# Patient Record
Sex: Female | Born: 1965 | Race: White | Hispanic: Yes | Marital: Single | Smoking: Never smoker
Health system: Southern US, Community
[De-identification: ages and names within clinical notes are randomized; demographics above are authoritative.]

## PROBLEM LIST (undated history)

## (undated) DIAGNOSIS — I10 Essential (primary) hypertension: Secondary | ICD-10-CM

---

## 2004-04-22 ENCOUNTER — Emergency Department (HOSPITAL_COMMUNITY): Admission: EM | Admit: 2004-04-22 | Discharge: 2004-04-22 | Payer: Self-pay | Admitting: *Deleted

## 2004-08-01 ENCOUNTER — Emergency Department (HOSPITAL_COMMUNITY): Admission: EM | Admit: 2004-08-01 | Discharge: 2004-08-01 | Payer: Self-pay | Admitting: Emergency Medicine

## 2004-08-29 ENCOUNTER — Ambulatory Visit (HOSPITAL_COMMUNITY): Admission: RE | Admit: 2004-08-29 | Discharge: 2004-08-29 | Payer: Self-pay | Admitting: *Deleted

## 2004-08-29 ENCOUNTER — Ambulatory Visit: Payer: Self-pay | Admitting: *Deleted

## 2004-11-14 ENCOUNTER — Ambulatory Visit (HOSPITAL_COMMUNITY): Admission: RE | Admit: 2004-11-14 | Discharge: 2004-11-14 | Payer: Self-pay | Admitting: Obstetrics and Gynecology

## 2004-12-19 ENCOUNTER — Ambulatory Visit: Payer: Self-pay | Admitting: Family Medicine

## 2004-12-19 ENCOUNTER — Inpatient Hospital Stay (HOSPITAL_COMMUNITY): Admission: AD | Admit: 2004-12-19 | Discharge: 2004-12-19 | Payer: Self-pay | Admitting: *Deleted

## 2004-12-20 ENCOUNTER — Inpatient Hospital Stay (HOSPITAL_COMMUNITY): Admission: AD | Admit: 2004-12-20 | Discharge: 2004-12-22 | Payer: Self-pay | Admitting: Family Medicine

## 2004-12-20 ENCOUNTER — Ambulatory Visit: Payer: Self-pay | Admitting: Obstetrics and Gynecology

## 2006-03-05 ENCOUNTER — Ambulatory Visit: Payer: Self-pay | Admitting: Obstetrics & Gynecology

## 2006-04-02 ENCOUNTER — Ambulatory Visit (HOSPITAL_COMMUNITY): Admission: RE | Admit: 2006-04-02 | Discharge: 2006-04-02 | Payer: Self-pay | Admitting: Obstetrics & Gynecology

## 2006-04-02 ENCOUNTER — Ambulatory Visit: Payer: Self-pay | Admitting: Obstetrics & Gynecology

## 2006-04-16 ENCOUNTER — Ambulatory Visit: Payer: Self-pay | Admitting: Obstetrics & Gynecology

## 2007-02-26 IMAGING — CR DG CHEST 1V
1 series · 1 of 1 positions shown · non-contrast
Comparison: None.

CLINICAL DATA: Chest pain and shortness of breath. Four months pregnant. Double
shielded.

CHEST - 1 VIEW

[view not recorded]
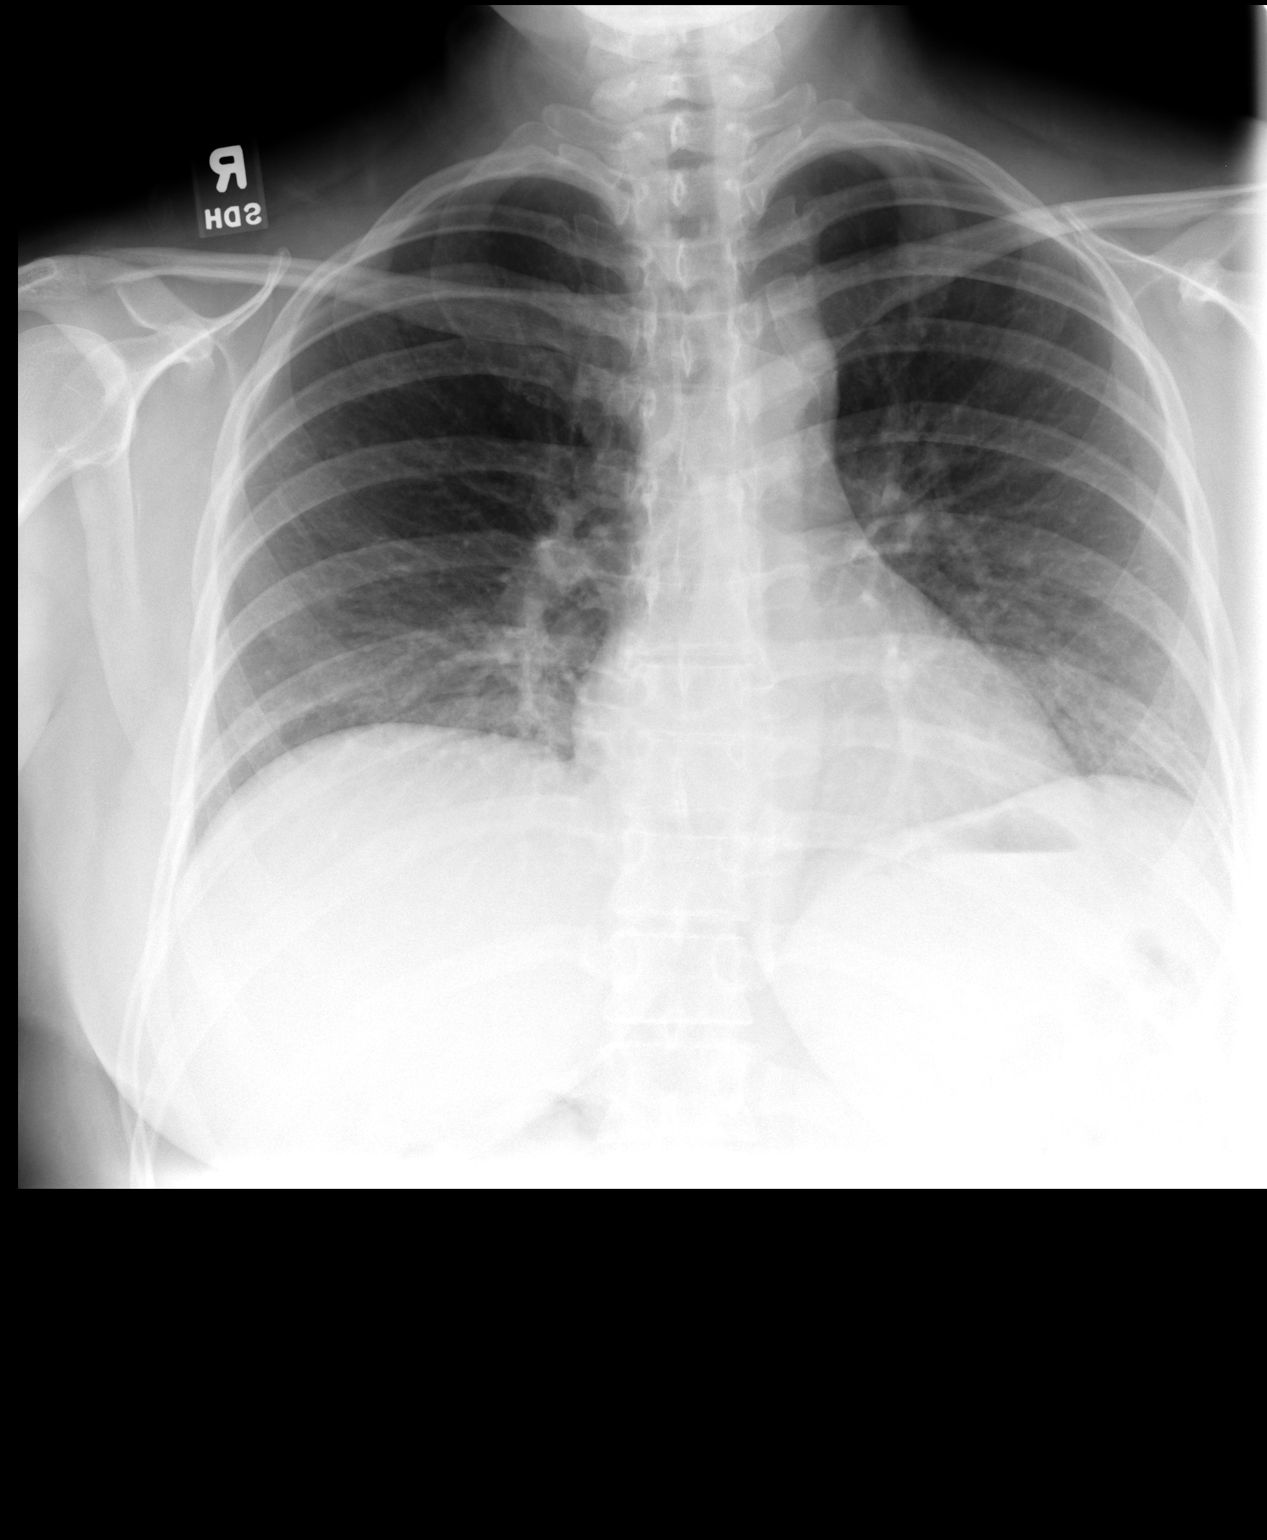

[1 of 1 positions shown; findings below may reference images not displayed]

FINDINGS: Normal sized heart. Poor inspiration. Mild diffuse peribronchial
thickening. Minimal scoliosis.

IMPRESSION

Mild bronchitic changes.

## 2007-06-11 IMAGING — US US OB FOLLOW-UP
1 series · 18 of 28 positions shown · non-contrast
Comparison: none

CLINICAL DATA: 34 weeks 6 days assigned gestational age.  Measuring small for dates.  Evaluate fetal growth and amniotic fluid.

[Series 1: us ob re-eval · 18 of 30 slices shown]
[im 1/30]
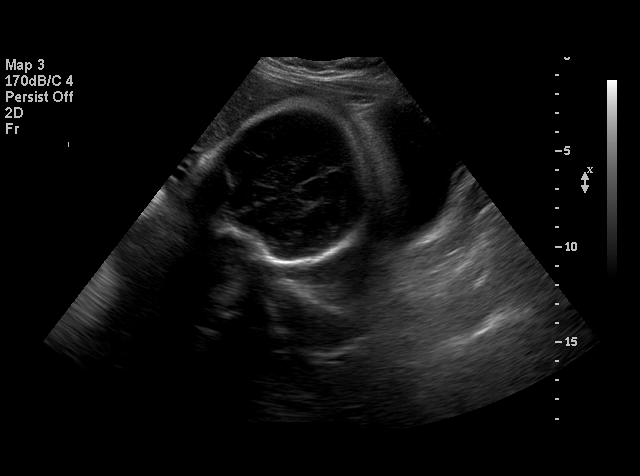
[im 3/30]
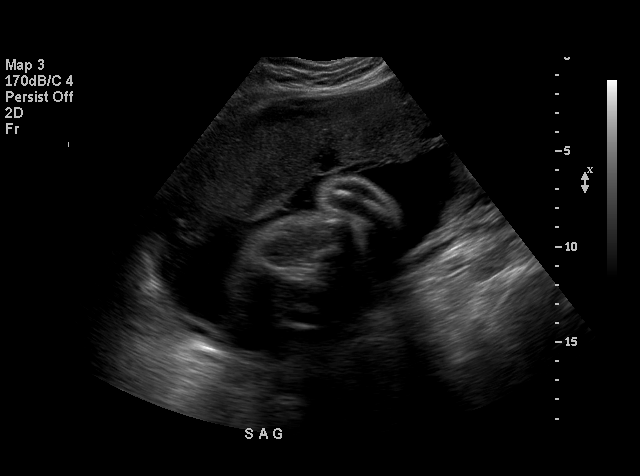
[im 4/30]
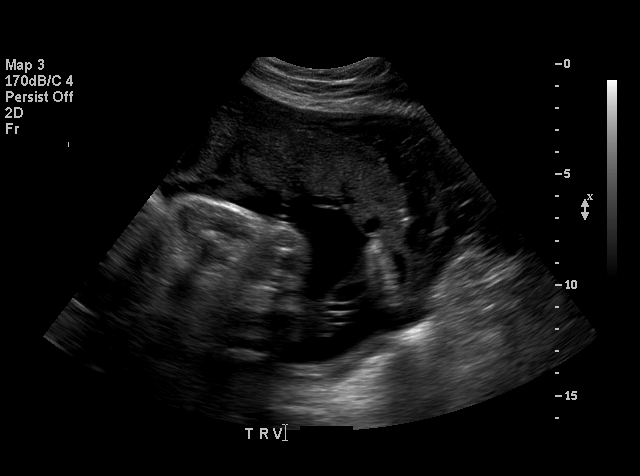
[im 6/30]
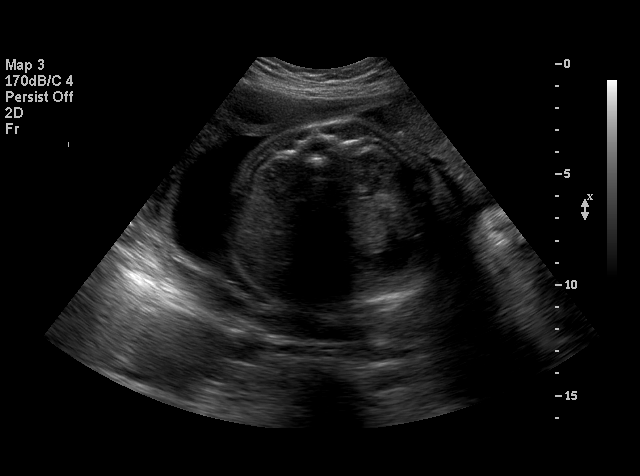
[im 8/30]
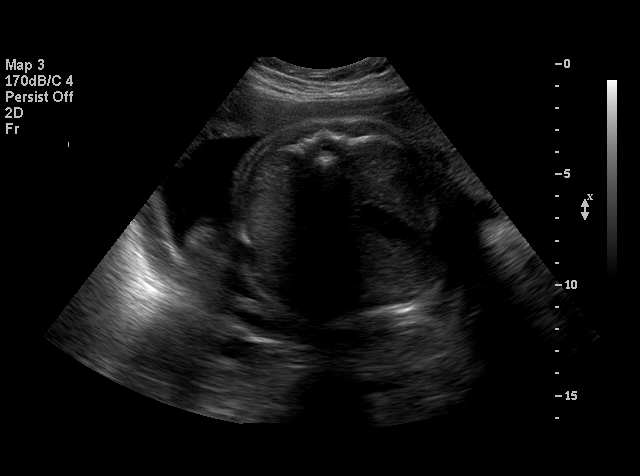
[im 9/30]
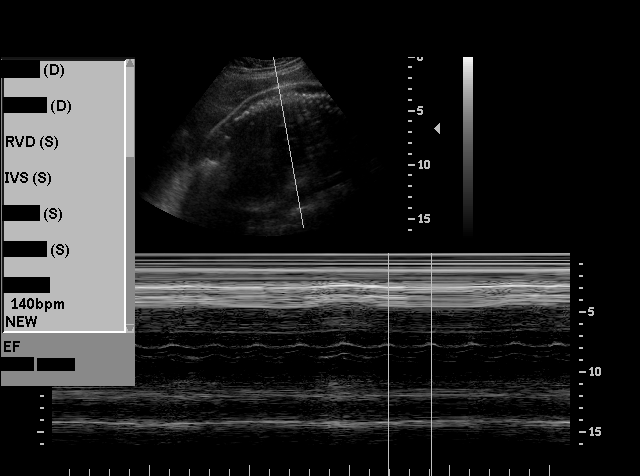
[im 11/30]
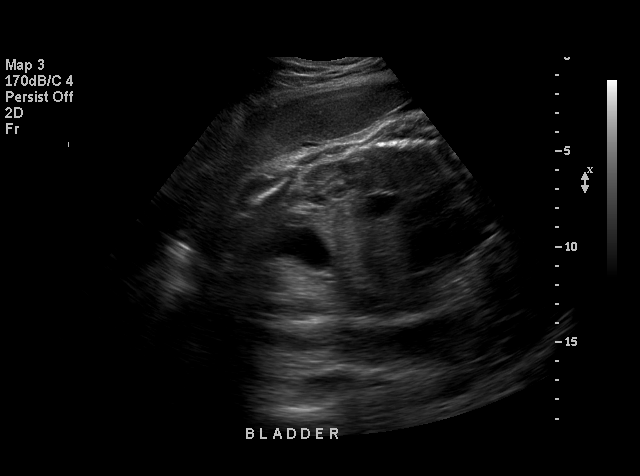
[im 12/30]
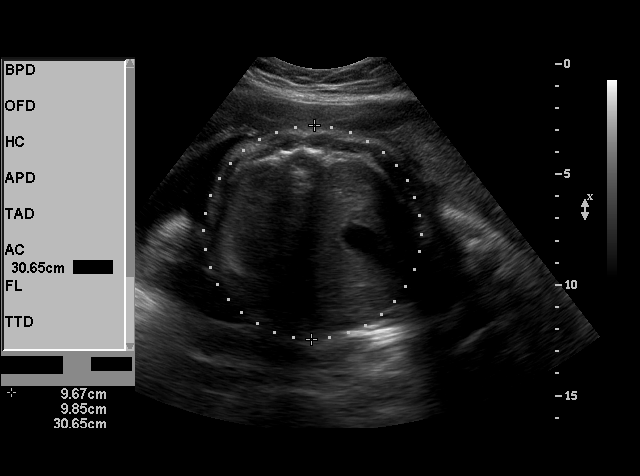
[im 14/30]
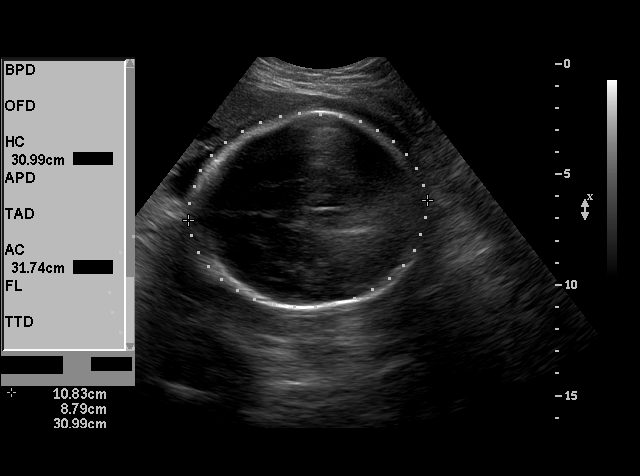
[im 16/30]
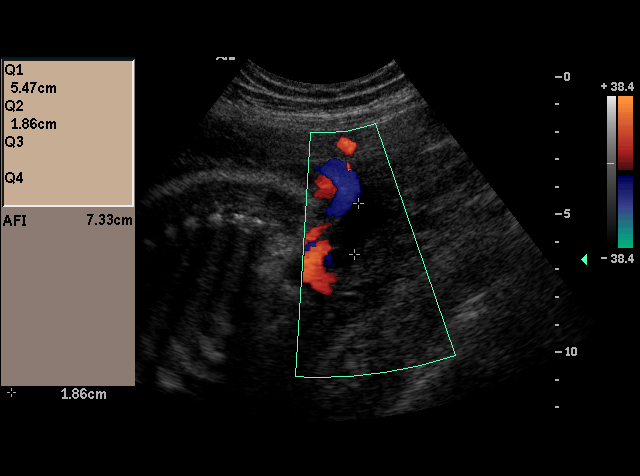
[im 18/30]
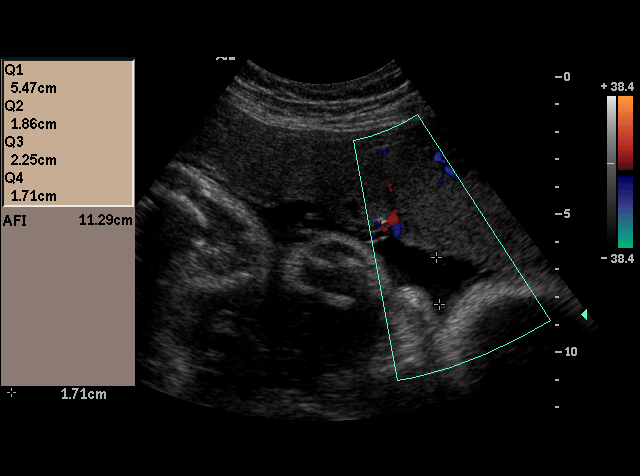
[im 19/30]
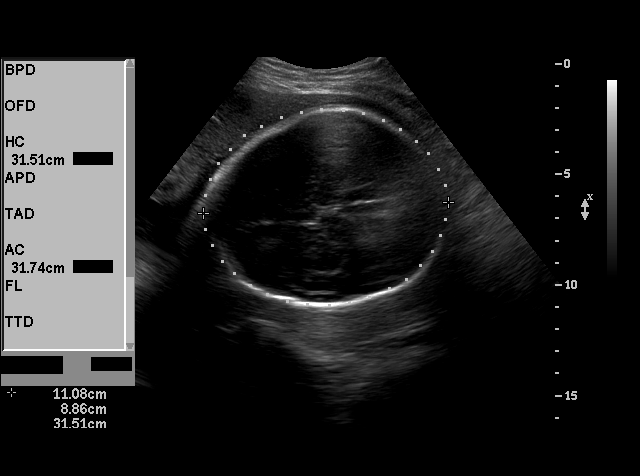
[im 21/30]
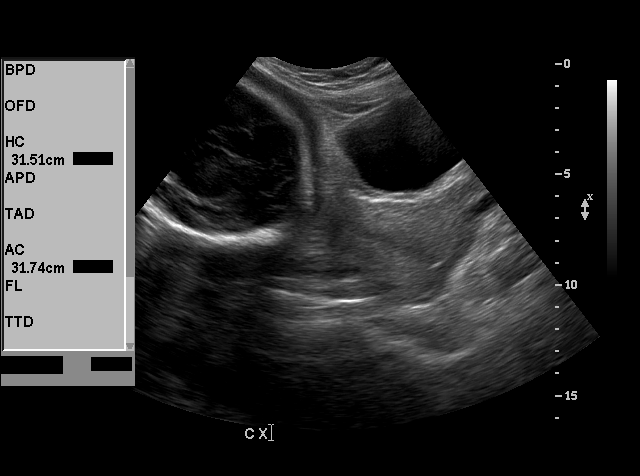
[im 23/30]
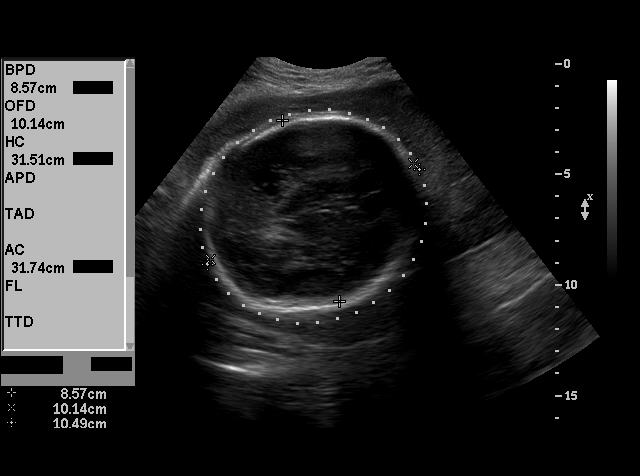
[im 24/30]
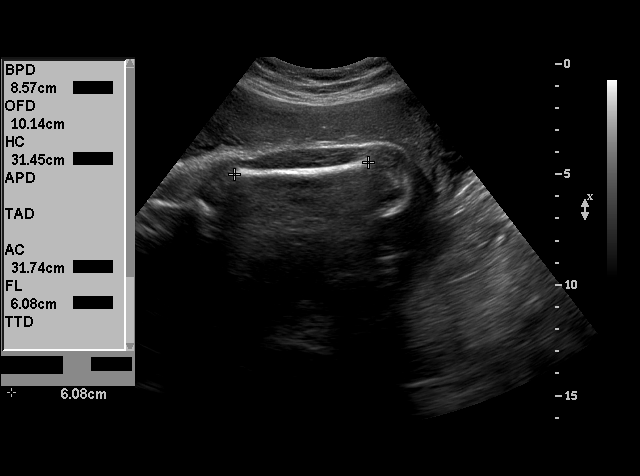
[im 26/30]
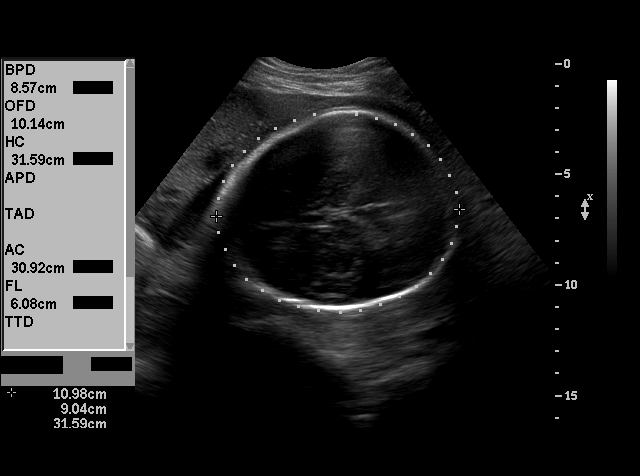
[im 27/30]
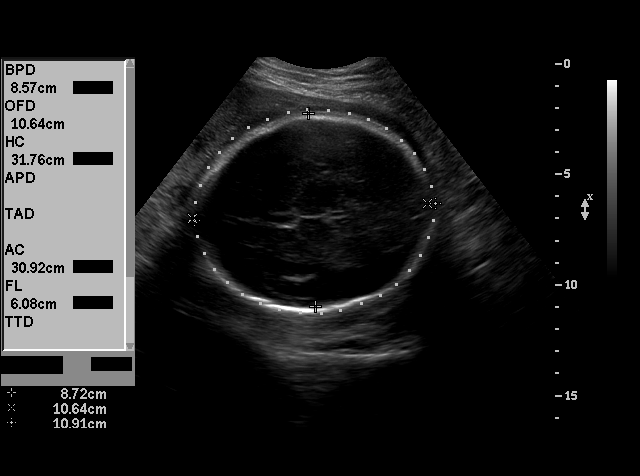
[im 30/30]
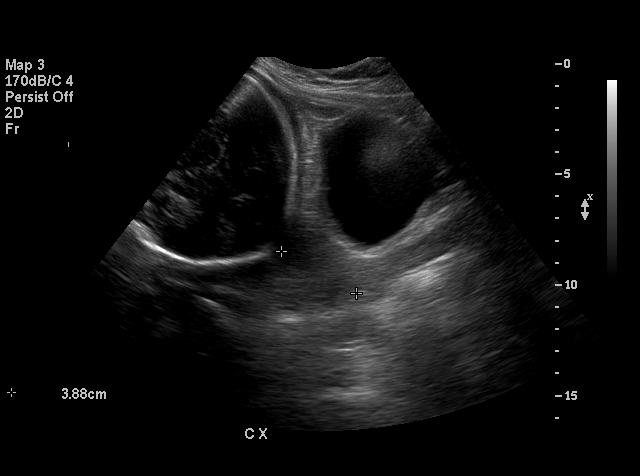

[18 of 28 positions shown; findings below may reference images not displayed]

OBSTETRICAL ULTRASOUND RE-EVALUATION:
Number of Fetuses:  1
Heart Rate:  140 bpm
Movement:  Yes
Breathing:  Yes
Presentation:  Cephalic
Placental Location:  Anterior
Grade:  2
Previa:  No
Amniotic Fluid (subjective):  Normal
Amniotic Fluid (objective):  AFI:  11.3 cm (7.9-24.9 cm = 5th and 95th %ile for 35 weeks)

FETAL BIOMETRY
BPD:  8.6 cm  34 w  4 d
HC:  31.2 cm 35 w  1 d
AC:  31.1 cm  35 w  1 d
FL:  6.1 cm  37 w  5 d

Mean GA:  34 w  1 d
Assigned GA:  34 w  6 d

EFW:  7646 grams (S)  50-75%ile  (9787-2671g) for 35 weeks

FETAL ANATOMY
Lateral Ventricles:  Visualized 
Thalami/CSP:  Previously Visualized 
Posterior Fossa:  Previously Visualized 
Nuchal Region:  Previously Visualized 
Spine:  Previously Visualized 
4 Chamber Heart on Left:  Previously Visualized 
Stomach on Left:  Visualized 
3 Vessel Cord:  Previously Visualized 
Cord Insertion Site:  Previously Visualized 
Kidneys:  Visualized 
Bladder:  Visualized 
Extremities:  Previously Visualized 

MATERNAL UTERINE AND ADNEXAL FINDINGS
Cervix:  5.0 cm transvaginally
IMPRESSION: 1.  Assigned gestational age is currently 34 weeks 6 days.  Appropriate fetal growth, with EFW at 50-75th percentile.

2.  Normal amniotic fluid volume.  Normal cervical length.

## 2010-11-07 ENCOUNTER — Emergency Department (HOSPITAL_COMMUNITY)
Admission: EM | Admit: 2010-11-07 | Discharge: 2010-11-08 | Disposition: A | Discharge status: Home / Self Care | Payer: Worker's Compensation | Attending: Emergency Medicine | Admitting: Emergency Medicine

## 2010-11-07 DIAGNOSIS — X58XXXA Exposure to other specified factors, initial encounter: Secondary | ICD-10-CM | POA: Insufficient documentation

## 2010-11-07 DIAGNOSIS — S0003XA Contusion of scalp, initial encounter: Secondary | ICD-10-CM | POA: Insufficient documentation

## 2010-11-07 DIAGNOSIS — S1093XA Contusion of unspecified part of neck, initial encounter: Secondary | ICD-10-CM | POA: Insufficient documentation

## 2010-11-08 ENCOUNTER — Emergency Department (HOSPITAL_COMMUNITY): Payer: Worker's Compensation

## 2010-11-08 LAB — POCT I-STAT, CHEM 8
BUN: 21 mg/dL (ref 6–23)
Calcium, Ion: 1.22 mmol/L (ref 1.12–1.32)
Chloride: 105 mEq/L (ref 96–112)
Creatinine, Ser: 0.7 mg/dL (ref 0.50–1.10)
Glucose, Bld: 106 mg/dL — ABNORMAL HIGH (ref 70–99)
HCT: 38 % (ref 36.0–46.0)
Hemoglobin: 12.9 g/dL (ref 12.0–15.0)
Potassium: 4.5 mEq/L (ref 3.5–5.1)
Sodium: 138 mEq/L (ref 135–145)
TCO2: 25 mmol/L (ref 0–100)

## 2010-11-08 MED ORDER — IOHEXOL 300 MG/ML  SOLN: 75.0000 mL | Freq: Once | INTRAMUSCULAR | Status: DC | PRN

## 2011-11-08 ENCOUNTER — Emergency Department (HOSPITAL_COMMUNITY): Payer: Worker's Compensation

## 2011-11-08 ENCOUNTER — Encounter (HOSPITAL_COMMUNITY): Payer: Self-pay | Admitting: Emergency Medicine

## 2011-11-08 ENCOUNTER — Emergency Department (HOSPITAL_COMMUNITY)
Admission: EM | Admit: 2011-11-08 | Discharge: 2011-11-08 | Disposition: A | Discharge status: Home / Self Care | Payer: Worker's Compensation | Attending: Emergency Medicine | Admitting: Emergency Medicine

## 2011-11-08 DIAGNOSIS — S93401A Sprain of unspecified ligament of right ankle, initial encounter: Secondary | ICD-10-CM

## 2011-11-08 DIAGNOSIS — Y9289 Other specified places as the place of occurrence of the external cause: Secondary | ICD-10-CM | POA: Insufficient documentation

## 2011-11-08 DIAGNOSIS — S93409A Sprain of unspecified ligament of unspecified ankle, initial encounter: Secondary | ICD-10-CM | POA: Insufficient documentation

## 2011-11-08 DIAGNOSIS — W010XXA Fall on same level from slipping, tripping and stumbling without subsequent striking against object, initial encounter: Secondary | ICD-10-CM | POA: Insufficient documentation

## 2011-11-08 DIAGNOSIS — Y990 Civilian activity done for income or pay: Secondary | ICD-10-CM | POA: Insufficient documentation

## 2011-11-08 DIAGNOSIS — M25551 Pain in right hip: Secondary | ICD-10-CM

## 2011-11-08 DIAGNOSIS — W19XXXA Unspecified fall, initial encounter: Secondary | ICD-10-CM

## 2011-11-08 DIAGNOSIS — M25559 Pain in unspecified hip: Secondary | ICD-10-CM | POA: Insufficient documentation

## 2011-11-08 MED ORDER — CYCLOBENZAPRINE HCL 5 MG PO TABS: 5.0000 mg | ORAL_TABLET | Freq: Three times a day (TID) | ORAL | Status: AC | PRN

## 2011-11-08 MED ORDER — IBUPROFEN 800 MG PO TABS: 800.0000 mg | ORAL_TABLET | Freq: Three times a day (TID) | ORAL | Status: AC

## 2011-11-08 MED ORDER — TRAMADOL HCL 50 MG PO TABS
50.0000 mg | ORAL_TABLET | Freq: Once | ORAL | Status: AC
  Administered 2011-11-08: 50 mg via ORAL
  Filled 2011-11-08: qty 1

## 2011-11-08 NOTE — ED Notes (Signed)
Son stated, (translator) SHE FELL AT WORK AND C/O RT. FOOT AND LEG PAIN

## 2011-11-08 NOTE — Progress Notes (Signed)
Orthopedic Tech Progress Note Patient Details:  Shari Hamilton 1966-01-21 [id number]  Ortho Devices Type of Ortho Device: ASO Ortho Device/Splint Location: right LE Ortho Device/Splint Interventions: Application   Shari Hamilton T 11/08/2011, 10:24 AM

## 2011-11-08 NOTE — ED Provider Notes (Signed)
History     CSN: [id number]  Arrival date & time 11/08/11  0756   First MD Initiated Contact with Patient 11/08/11 [phone]      Chief Complaint  Patient presents with  . Foot Injury    (Consider location/radiation/quality/duration/timing/severity/associated sxs/prior treatment) HPI Hx from pt. Shari Hamilton is a 46 y.o. female who presents for evaluation of right leg pain. States she was at work when she tripped over some boxes and fell, inverting her ankle in the process and landing on her buttocks. She is currently experiencing pain to the lateral right hip and to her lateral right ankle. She denies any numbness or tingling to the extremity or swelling to the areas. No treatment prior to coming here. Pain is throbbing in nature and worsens with walking and weight bearing.    History reviewed. No pertinent past medical history.  History reviewed. No pertinent past surgical history.  No family history on file.  History  Substance Use Topics  . Smoking status: Never Smoker   . Smokeless tobacco: Not on file  . Alcohol Use: No    OB History    Grav Para Term Preterm Abortions TAB SAB Ect Mult Living                  Review of Systems  Constitutional: Negative.   Musculoskeletal: Positive for arthralgias. Negative for back pain and gait problem.  Skin: Negative for wound.  Neurological: Negative for weakness and numbness.    Allergies  Review of patient's allergies indicates no known allergies.  Home Medications   Current Outpatient Rx  Name Route Sig Dispense Refill  . DESLORATADINE 5 MG PO TABS Oral Take 5 mg by mouth daily as needed.    . ADULT MULTIVITAMIN W/MINERALS CH Oral Take 1 tablet by mouth daily.    . CYCLOBENZAPRINE HCL 5 MG PO TABS Oral Take 1 tablet (5 mg total) by mouth 3 (three) times daily as needed for muscle spasms. 30 tablet 0  . IBUPROFEN 800 MG PO TABS Oral Take 1 tablet (800 mg total) by mouth 3 (three) times daily. Take with food. 21  tablet 0    BP 121/85  Pulse 92  Temp 98 F (36.7 C) (Oral)  Resp 18  SpO2 94%  LMP 11/07/2011  Physical Exam  Nursing note and vitals reviewed. Constitutional: She appears well-developed and well-nourished. No distress.  HENT:  Head: Normocephalic and atraumatic.  Neck: Normal range of motion.  Cardiovascular: Normal rate.   Pulmonary/Chest: Effort normal.  Musculoskeletal: Normal range of motion.       Legs:      R hip: ttp over the greater trochanteric area. No crepitus appreciated. No LE shortening/rotation. Good functional rom in the hip.   R ankle: ttp to lat malleolus, deltoid ligament. Nontender to palp over 5th MT. Good functional passive rom in the ankle, active rom limited 2/2 pain. Anterior drawer testing neg.  Spine: No palpable stepoff, crepitus, or gross deformity appreciated. No appreciable spasm of paravertebral muscles. No midline tenderness.  nontender to palp to the remainder of the leg, good rom in knee, ext neurovasc intact, 2+ dp/pt pulses  Neurological: She is alert.  Skin: Skin is warm and dry. She is not diaphoretic.  Psychiatric: She has a normal mood and affect.    ED Course  Procedures (including critical care time)  Labs Reviewed - No data to display Dg Hip Complete Right  11/08/2011  *RADIOLOGY REPORT*  Clinical Data: Fall, right hip  pain  RIGHT HIP - COMPLETE 2+ VIEW  Comparison: Concurrently obtained radiographs of the right ankle  Findings: Frontal pelvic radiograph and additional views of the right hip demonstrate no acute fracture, or malalignment.  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  Minimal degenerative changes noted in the left hip joint.  Surgical clips project over the anatomic pelvis consistent with bilateral tubal ligation. Visualized bowel gas pattern is unremarkable.  IMPRESSION:  1.  No  acute fracture, or malalignment. 2.  Mild right hip joint degenerative changes with prominence of the lateral aspect of the acetabular roof and associated subchondral cyst formation.  Similar findings can be seen in a pincer type femoral acetabular impingement (FAI).   Original Report Authenticated By: Alvino Blood Ankle Complete Right  11/08/2011  *RADIOLOGY REPORT*  Clinical Data: Fall, lateral ankle pain  RIGHT ANKLE - COMPLETE 3+ VIEW  Comparison: Concurrently obtained radiographs of the pelvis and right hip  Findings: No acute fracture, malalignment or ankle joint effusion. The ankle mortise is congruent.  There is mild soft tissue swelling about the anterior and lateral ankle at the malleolus.  The base of the fifth metatarsal is intact.  IMPRESSION: Soft tissue swelling about the lateral malleolus without evidence of acute fracture, or bony malalignment.   Original Report Authenticated By: Vilma Prader    I personally reviewed the xrays via PACS  1. Right ankle sprain   2. Right hip pain   3. Fall       MDM  Pt presents with pain to her R lower ext after a fall while at work. She is experiencing some pain around the lat malleolus of her ankle and around the greater trochanter of her hip. She is neurovasc intact and has good rom in the joints. Xrays reviewed by me, neg for fx - some slight degenerative changes in hip. Advised conservative tx with ice, rest, elevation, NSAIDs. Will give ASO for ankle. Advised f/u with ortho in several days if not improving. Reasons to return to ED discussed.       Grant Fontana, PA-C 11/08/11 1007

## 2011-11-08 NOTE — ED Provider Notes (Signed)
Medical screening examination/treatment/procedure(s) were performed by non-physician practitioner and as supervising physician I was immediately available for consultation/collaboration.   Octavious Zidek, MD 11/08/11 2030 

## 2015-02-26 DIAGNOSIS — N10 Acute tubulo-interstitial nephritis: Secondary | ICD-10-CM | POA: Insufficient documentation

## 2020-06-10 DIAGNOSIS — R102 Pelvic and perineal pain: Secondary | ICD-10-CM | POA: Insufficient documentation

## 2020-06-10 DIAGNOSIS — A498 Other bacterial infections of unspecified site: Secondary | ICD-10-CM | POA: Insufficient documentation

## 2022-04-04 ENCOUNTER — Other Ambulatory Visit: Payer: Self-pay

## 2022-04-04 ENCOUNTER — Emergency Department (HOSPITAL_COMMUNITY)
Admission: EM | Admit: 2022-04-04 | Discharge: 2022-04-04 | Disposition: A | Discharge status: Home / Self Care | Payer: Self-pay | Attending: Emergency Medicine | Admitting: Emergency Medicine

## 2022-04-04 ENCOUNTER — Encounter (HOSPITAL_COMMUNITY): Payer: Self-pay

## 2022-04-04 ENCOUNTER — Emergency Department (HOSPITAL_COMMUNITY): Payer: Worker's Compensation

## 2022-04-04 DIAGNOSIS — I10 Essential (primary) hypertension: Secondary | ICD-10-CM | POA: Insufficient documentation

## 2022-04-04 DIAGNOSIS — R112 Nausea with vomiting, unspecified: Secondary | ICD-10-CM

## 2022-04-04 DIAGNOSIS — K2970 Gastritis, unspecified, without bleeding: Secondary | ICD-10-CM

## 2022-04-04 HISTORY — DX: Essential (primary) hypertension: I10

## 2022-04-04 LAB — LIPASE, BLOOD: Lipase: 32 U/L (ref 11–51)

## 2022-04-04 LAB — CBC WITH DIFFERENTIAL/PLATELET
Abs Immature Granulocytes: 0.03 10*3/uL (ref 0.00–0.07)
Basophils Absolute: 0.1 10*3/uL (ref 0.0–0.1)
Basophils Relative: 1 %
Eosinophils Absolute: 0.3 10*3/uL (ref 0.0–0.5)
Eosinophils Relative: 3 %
HCT: 46.4 % — ABNORMAL HIGH (ref 36.0–46.0)
Hemoglobin: 14.7 g/dL (ref 12.0–15.0)
Immature Granulocytes: 0 %
Lymphocytes Relative: 35 %
Lymphs Abs: 3 10*3/uL (ref 0.7–4.0)
MCH: 27.4 pg (ref 26.0–34.0)
MCHC: 31.7 g/dL (ref 30.0–36.0)
MCV: 86.4 fL (ref 80.0–100.0)
Monocytes Absolute: 0.4 10*3/uL (ref 0.1–1.0)
Monocytes Relative: 5 %
Neutro Abs: 4.6 10*3/uL (ref 1.7–7.7)
Neutrophils Relative %: 56 %
Platelets: 307 10*3/uL (ref 150–400)
RBC: 5.37 MIL/uL — ABNORMAL HIGH (ref 3.87–5.11)
RDW: 13.9 % (ref 11.5–15.5)
WBC: 8.3 10*3/uL (ref 4.0–10.5)
nRBC: 0 % (ref 0.0–0.2)

## 2022-04-04 LAB — URINALYSIS, ROUTINE W REFLEX MICROSCOPIC
Bilirubin Urine: NEGATIVE
Glucose, UA: NEGATIVE mg/dL
Ketones, ur: NEGATIVE mg/dL
Nitrite: NEGATIVE
Protein, ur: NEGATIVE mg/dL
Specific Gravity, Urine: 1.001 — ABNORMAL LOW (ref 1.005–1.030)
pH: 7 (ref 5.0–8.0)

## 2022-04-04 LAB — COMPREHENSIVE METABOLIC PANEL
ALT: 33 U/L (ref 0–44)
AST: 30 U/L (ref 15–41)
Albumin: 3.2 g/dL — ABNORMAL LOW (ref 3.5–5.0)
Alkaline Phosphatase: 81 U/L (ref 38–126)
Anion gap: 12 (ref 5–15)
BUN: 8 mg/dL (ref 6–20)
CO2: 25 mmol/L (ref 22–32)
Calcium: 9 mg/dL (ref 8.9–10.3)
Chloride: 100 mmol/L (ref 98–111)
Creatinine, Ser: 0.45 mg/dL (ref 0.44–1.00)
GFR, Estimated: >60 mL/min (ref >60)
Glucose, Bld: 168 mg/dL — ABNORMAL HIGH (ref 70–99)
Potassium: 4.1 mmol/L (ref 3.5–5.1)
Sodium: 137 mmol/L (ref 135–145)
Total Bilirubin: 0.9 mg/dL (ref 0.3–1.2)
Total Protein: 7 g/dL (ref 6.5–8.1)

## 2022-04-04 LAB — PREGNANCY, URINE: Preg Test, Ur: NEGATIVE

## 2022-04-04 MED ORDER — ONDANSETRON 4 MG PO TBDP
4.0000 mg | ORAL_TABLET | Freq: Once | ORAL | Status: AC
  Administered 2022-04-04: 4 mg via ORAL
  Filled 2022-04-04: qty 1

## 2022-04-04 MED ORDER — ALUM & MAG HYDROXIDE-SIMETH 200-200-20 MG/5ML PO SUSP
30.0000 mL | Freq: Once | ORAL | Status: AC
  Administered 2022-04-04: 30 mL via ORAL
  Filled 2022-04-04: qty 30

## 2022-04-04 MED ORDER — DICYCLOMINE HCL 10 MG PO CAPS
10.0000 mg | ORAL_CAPSULE | Freq: Once | ORAL | Status: AC
  Administered 2022-04-04: 10 mg via ORAL
  Filled 2022-04-04: qty 1

## 2022-04-04 MED ORDER — FAMOTIDINE 20 MG PO TABS: 20.0000 mg | ORAL_TABLET | Freq: Two times a day (BID) | ORAL | 0 refills | Status: AC

## 2022-04-04 MED ORDER — ONDANSETRON 4 MG PO TBDP: 8.0000 mg | ORAL_TABLET | Freq: Once | ORAL | Status: DC

## 2022-04-04 NOTE — ED Provider Triage Note (Addendum)
Emergency Medicine Provider Triage Evaluation Note  Shari Hamilton , a 57 y.o. female  was evaluated in triage.  Pt complains of epigastric pain that has been present for 6 days. Patient endorsed 4 episodes non bloody emesis on Sunday.  Patient states she still feels nauseous.  Patient stated epigastric pain does not radiate.  Patient also endorsed dizziness that has been present for several months now.  Patient does not know what causes her dizziness but denied any episodes of syncope.  Patient denied chest pain, shortness of breath, urinary/bowel symptoms, changes in sensation/motor skills  Review of Systems  Positive: See HPI Negative: See HPI  Physical Exam  BP (!) 165/100 (BP Location: Right Arm)   Pulse (!) 107   Temp 98.9 F (37.2 C) (Oral)   Resp 18   SpO2 91%  Gen:   Awake, no distress   Resp:  Normal effort, no respiratory distress MSK:   Moves extremities without difficulty  Other:  Epigastric tenderness however no peritoneal signs noted, lungs clear to auscultation bilaterally, heart was slightly tachycardic however no rubs murmurs gallops or clicks noted, patient had equal strength in both hands and had PERRL eyes  Medical Decision Making  Medically screening exam initiated at 1:01 PM.  Appropriate orders placed.  Shaneeka Navarro-Rodriguez was informed that the remainder of the evaluation will be completed by another provider, this initial triage assessment does not replace that evaluation, and the importance of remaining in the ED until their evaluation is complete.  Workup initiated, obtaining a history was difficult as the translator was having difficulty translating for the patient so history remains unclear, dizziness appears to be chronic without red flag signs, CT abdomen pelvis with contrast was ordered to evaluate for intra-abdominal pathology, patient was noted to be hypoxic at 90 to 91% on room air and will be put on nasal cannula 2L.  Patient does not oxygen  at home.  Did not show signs of respiratory distress.  Patient will get next room.      Chuck Hint, PA-C 04/04/22 1315

## 2022-04-04 NOTE — ED Provider Notes (Signed)
[facility] Provider Note   CSN: [id number] Arrival date & time: 04/04/22  1244     History {Add pertinent medical, surgical, social history, OB history to HPI:1} Chief Complaint  Patient presents with   Abdominal Pain    Shari Hamilton is a 57 y.o. female with h/o pyelonephritis who presents with abdominal pain.   Pt complains of burning epigastric pain that has been present for 6 days. Patient endorsed 4 episodes non bloody emesis on Sunday.  Patient states she still feels mildly nauseous.  Patient stated epigastric pain at times radiates to her left upper quadrant, at times has pain in her R flank. Feels bloated intermittently. Had pain improved after visited urgent care on 2/19 and received promethazine and bentyl. Had no pain from Tues-Thursday until yesterday pain and nausea returned. Patient denied chest pain, shortness of breath, urinary/bowel symptoms, changes in sensation/motor skills. Has had this pain once before many years ago but it went away, was told it may be due to gastritis. No h/o abdominal surgical history. Also endorses foul smelling urine starting a few days ago, no dysuria/hematuria, no vaginal symptoms. Hasn't tried anything for the pain. Is still passing gas, last BM was 2 days ago.   Abdominal Pain      Home Medications Prior to Admission medications   Medication Sig Start Date End Date Taking? Authorizing Provider  cyclobenzaprine (FLEXERIL) 5 MG tablet Take 1 tablet (5 mg total) by mouth 3 (three) times daily as needed for muscle spasms. 11/08/11   Abran Richard, PA-C  desloratadine (CLARINEX) 5 MG tablet Take 5 mg by mouth daily as needed.    [provider]  ibuprofen (ADVIL,MOTRIN) 800 MG tablet Take 1 tablet (800 mg total) by mouth 3 (three) times daily. Take with food. 11/08/11   Abran Richard, PA-C  Multiple Vitamin (MULTIVITAMIN WITH MINERALS) TABS Take 1 tablet by mouth daily.     [provider]      Allergies    Patient has no known allergies.    Review of Systems   Review of Systems  Gastrointestinal:  Positive for abdominal pain.   Review of systems Negative for f/c.  A 10 point review of systems was performed and is negative unless otherwise reported in HPI.  Physical Exam Updated Vital Signs BP (!) 164/88 (BP Location: Right Arm)   Pulse 91   Temp 98 F (36.7 C) (Oral)   Resp (!) 24   SpO2 91%  Physical Exam General: Normal appearing female, lying in bed.  HEENT: Sclera anicteric, MMM, trachea midline.  Cardiology: RRR, no murmurs/rubs/gallops. BL radial and DP pulses equal bilaterally.  Resp: Normal respiratory rate and effort. CTAB, no wheezes, rhonchi, crackles.  Abd: Soft, non-tender, non-distended. No rebound tenderness or guarding.  GU: Deferred. MSK: No peripheral edema or signs of trauma. Extremities without deformity or TTP. No cyanosis or clubbing. Skin: warm, dry. No rashes or lesions. Back: No CVA tenderness Neuro: A&Ox4, CNs II-XII grossly intact. MAEs. Sensation grossly intact.  Psych: Normal mood and affect.   ED Results / Procedures / Treatments   Labs (all labs ordered are listed, but only abnormal results are displayed) Labs Reviewed  CBC WITH DIFFERENTIAL/PLATELET - Abnormal; Notable for the following components:      Result Value   RBC 5.37 (*)    HCT 46.4 (*)    All other components within normal limits  URINALYSIS, ROUTINE W REFLEX MICROSCOPIC - Abnormal; Notable for the following  components:   APPearance HAZY (*)    Specific Gravity, Urine 1.001 (*)    Hgb urine dipstick SMALL (*)    Leukocytes,Ua MODERATE (*)    Bacteria, UA RARE (*)    All other components within normal limits  PREGNANCY, URINE  COMPREHENSIVE METABOLIC PANEL  LIPASE, BLOOD  TROPONIN I (HIGH SENSITIVITY)    EKG EKG Interpretation  Date/Time:  Saturday April 04 2022 12:58:51 EST Ventricular Rate:  98 PR Interval:  142 QRS  Duration: 76 QT Interval:  334 QTC Calculation: 426 R Axis:   3 Text Interpretation: Normal sinus rhythm Confirmed by Cindee Lame [phone]) on 04/04/2022 1:35:29 PM  Radiology No results found.  Procedures Procedures  {Document cardiac monitor, telemetry assessment procedure when appropriate:1}  Medications Ordered in ED Medications  ondansetron (ZOFRAN-ODT) disintegrating tablet 4 mg (4 mg Oral Given 04/04/22 1317)    ED Course/ Medical Decision Making/ A&P                          Medical Decision Making   This patient presents to the ED for concern of epigastric pain, this involves an extensive number of treatment options, and is a complaint that carries with it a high risk of complications and morbidity.  I considered the following differential and admission for this acute, potentially life threatening condition.   MDM:    For DDX for abdominal pain includes but is not limited to:  Abdominal exam without peritoneal signs. No evidence of acute abdomen at this time.   With colicky epigastric abdominal pain, consider pancreatitis, cholecystitis/cholelithiasis, PUD/gastritis.  Patient also endorses pain in her right flank at times and malodorous urine, consider pyelonephritis.  She has no CVA tenderness at this time and appears very well, low concern for nephrolithiasis/ureterolithiasis.  No right lower quadrant pain or left lower quadrant pain to indicate appendicitis or diverticulitis.  She does endorse constipation but has no nausea or vomiting at this time and is still passing gas, low concern for small bowel obstruction.  Patient denies any significant pain at this time and actually has no tenderness palpation in her abdomen.    Clinical Course as of 04/04/22 1454  Sat Apr 04, 2022  1402 Preg Test, Ur: NEGATIVE [HN]  1402 Urinalysis, Routine w reflex microscopic -Urine, Clean Catch(!) Neg UA for UTI [HN]    Clinical Course User Index [HN] Audley Hose, MD     Labs: I Ordered, and personally interpreted labs.  The pertinent results include:  those listed above  Imaging Studies ordered: I ordered imaging studies including RUQ [place] I independently visualized and interpreted imaging. I agree with the radiologist interpretation  Additional history obtained from husband at bedside.  External records from outside source obtained and reviewed including UC notes  Cardiac Monitoring: The patient was maintained on a cardiac monitor.  I personally viewed and interpreted the cardiac monitored which showed an underlying rhythm of: NSR  Reevaluation: After the interventions noted above, I reevaluated the patient and found that they have :{resolved/improved/worsened:23923::"improved"}  Social Determinants of Health: Patient lives independently  Disposition:  ***  Co morbidities that complicate the patient evaluation  Past Medical History:  Diagnosis Date   Hypertension      Medicines Meds ordered this encounter  Medications   DISCONTD: ondansetron (ZOFRAN-ODT) disintegrating tablet 8 mg   ondansetron (ZOFRAN-ODT) disintegrating tablet 4 mg    I have reviewed the patients home medicines and have made adjustments as needed  Problem List / ED Course: Problem List Items Addressed This Visit   None        {Document critical care time when appropriate:1} {Document review of labs and clinical decision tools ie heart score, Chads2Vasc2 etc:1}  {Document your independent review of radiology images, and any outside records:1} {Document your discussion with family members, caretakers, and with consultants:1} {Document social determinants of health affecting pt's care:1} {Document your decision making why or why not admission, treatments were needed:1}  This note was created using dictation software, which may contain spelling or grammatical errors.

## 2022-04-04 NOTE — ED Triage Notes (Signed)
Pt c/o epigastric burning pain of abdomenx1wk. Pt states the pain got worse last night. Pt c/o N/V a week ago. Pt c/o nausea. Pt c/o int dizziness for a few months.

## 2022-04-04 NOTE — Discharge Instructions (Addendum)
Gracias por venir al [facility]. Lo atendieron por dolor ardiente epigstrico. Hicimos un examen, laboratorios e imgenes, y estos mostraron hgado graso en la ecografa, pero no hubo hallazgos agudos. Es posible que est experimentando gastritis o irritacin del revestimiento del estmago debido al cido del estmago. Tome 20 mg [place] [address] Es posible que deba ser evaluado por Radiation protection practitioner. Tambin le hemos recetado zofran para las nuseas y los vmitos. Haga un seguimiento con su proveedor de atencin primaria dentro de 1 semana. Tambin puede hablar sobre su hgado graso y su hipertensin.  No dude en regresar al servicio de urgencias o llamar al 911 si experimenta: -Empeoramiento de los sntomas -Nuseas, vmitos tan intensos que no se puede tomar nada por va oral. -Aturdimiento, desmayo. -Fiebre/escalofros -Cualquier otra cosa que te preocupe
